# Patient Record
Sex: Male | Born: 1960 | Race: White | Hispanic: No | Marital: Married
Health system: Midwestern US, Academic
[De-identification: ages and names within clinical notes are randomized; demographics above are authoritative.]

---

## 2021-09-18 ENCOUNTER — Encounter: Admit: 2021-09-18 | Discharge: 2021-09-18 | Payer: BC Managed Care – PPO

## 2021-09-18 DIAGNOSIS — M549 Dorsalgia, unspecified: Secondary | ICD-10-CM

## 2021-09-18 NOTE — Patient Instructions
It was a pleasure seeing you in clinic today.    Jinny Sweetland RN, CNC  Dr. Joshua T. Bunch/Ortho Spine Surgery  [facility]  [facility]  [address]  [place],  [address]  Phone: [phone]   Fax [phone]  Scheduling [phone]  [email or web address]    We appreciate the interest in your health.  The physician reviews both the radiology reports and the imaging independently and will contact you if there are any emergent findings.  If you would like, incidental and chronic changes seen in your images can be reviewed with Dr. Bunch at a follow up appointment.  At that appointment, the radiology report and terminology used in that report can also be explained. Please call our Schedule line if you would like to follow up.

## 2021-09-20 ENCOUNTER — Encounter: Admit: 2021-09-20 | Discharge: 2021-09-20 | Payer: BC Managed Care – PPO

## 2021-09-20 ENCOUNTER — Ambulatory Visit: Admit: 2021-09-20 | Discharge: 2021-09-20 | Payer: BC Managed Care – PPO

## 2021-09-20 DIAGNOSIS — I83813 Varicose veins of bilateral lower extremities with pain: Secondary | ICD-10-CM

## 2021-09-20 DIAGNOSIS — I428 Other cardiomyopathies: Secondary | ICD-10-CM

## 2021-09-20 DIAGNOSIS — M5136 Other intervertebral disc degeneration, lumbar region: Secondary | ICD-10-CM

## 2021-09-20 DIAGNOSIS — M2550 Pain in unspecified joint: Secondary | ICD-10-CM

## 2021-09-20 DIAGNOSIS — M48062 Spinal stenosis, lumbar region with neurogenic claudication: Secondary | ICD-10-CM

## 2021-09-20 DIAGNOSIS — M48061 Spinal stenosis, lumbar region without neurogenic claudication: Secondary | ICD-10-CM

## 2021-09-20 DIAGNOSIS — E785 Hyperlipidemia, unspecified: Secondary | ICD-10-CM

## 2021-09-20 DIAGNOSIS — R0789 Other chest pain: Secondary | ICD-10-CM

## 2021-09-20 DIAGNOSIS — M549 Dorsalgia, unspecified: Secondary | ICD-10-CM

## 2021-09-20 DIAGNOSIS — R002 Palpitations: Secondary | ICD-10-CM

## 2021-09-20 DIAGNOSIS — R42 Dizziness and giddiness: Secondary | ICD-10-CM

## 2021-09-20 DIAGNOSIS — M5416 Radiculopathy, lumbar region: Secondary | ICD-10-CM

## 2021-09-20 DIAGNOSIS — I10 Essential (primary) hypertension: Secondary | ICD-10-CM

## 2021-09-20 NOTE — Progress Notes
SPINE CENTER HISTORY AND PHYSICAL      CHIEF COMPLAINT     Chief Complaint   Patient presents with   ? Lower Back - Pain     Pain into both legs   ? New Patient          HISTORY OF PRESENT ILLNESS   Jeremy Hardy is a 60 y.o. male.  He presents for further evaluation of low back pain.  No real pain that goes down his legs.  Worse with standing and walking.  Improves when he sits or lays down.  Positive shopping cart sign.  He has had three injections, which were done at [facility] in [place]. [place].  The first two injections helps.  The third injection gave him no relief.  His last injection was 4-5 years ago.  No RFA or spinal cord stimulator.  He has tried chiropractic treatment and physical therapy in the past.  No recent PT.  He is employed as a Scientist, research (physical sciences).      Previous Research officer, political party -  No    NSAIDS: aspirin, celecoxib (Celebrex), naproxen (Aleve)  PT: Yes - none recent  Pain medications: hydrocodone/acetaminophen (Vicodin, Lortab, Norco)  Chiropractic: Yes  Activity modification: Yes  Injections: Yes  How many?  3 - last inj. ~4-5 yrs ago       Spine Comprehensive History Form  HPI     When did your pain start?    Greater than 1 year Where is your worst pain?    Lower back    Have you been given a diagnosis for your pain?      Yes Explanation of diagnosis:      Bulginging disc, arthritis, narrowing of spine    Is this work related?    No Date of Injury:           Was there a cause of injury (e.g.. fall, MVA, heavy lifting)?      No Please explain injury:           Is the pain constant or does it come and go?     Constant Words that best describe the quality of your pain:      Aching, Shooting, Throbbing    What makes the pain better? (e.g. rest, sitting down, ice, heat, medications):      Sitting down, Medications Other reason that makes the pain better?           What makes the pain worse? (e.g. sit, stand, walk, bend):      Stand Other reason that makes the pain worse?            How long can you sit before the pain occurs? (in minutes)    0-15 How long can you stand before the pain occurs? (in minutes)    15-30 How long can you walk before the pain occurs? (in minutes)    15-30   Does the pain move into your arm or leg?    No Explanation of how far pain moves:         Do you have numbness in your arm or leg?      None Explanation of numbness in arm from start point to end point:      Explanation of numbness in leg from start point to end point:        Do you have weakness in your arm or leg?      None Explanation of weakness in arm from start  point to end point:      Explanation of weakness in leg from start point to end point:        Have you lost control of bladder or bowel function?    No Explanation of lost control of bladder or bowel function:         Is the pain worse at night?    No Description of how pain effects sleep:         Have you had any unplanned weight loss?    No       PAIN SCALE    You are here for pain. Where is the pain?    Low back    On a scale of 0 to 10, how bad is your low back pain?    10 On a scale of 0 to 10, what was your average low back pain level in the past week?    7   On a scale of 0 to 10, how bad is your leg or foot pain?      On a scale of 0 to 10, what was your average leg or foot pain level in the past week?        On a scale of 0 to 10, how bad is your middle back pain?        On a scale of 0 to 10, what was your average middle back pain level in the past week?        On a scale of 0 to 10, how bad is your neck or upper back pain?      On a scale of 0 to 10, what was your average neck or upper back pain level in the past week?        On a scale of 0 to 10, how bad is your arm or hand pain?      On a scale of 0 to 10, what was your average arm or hand pain level in the past week?          SCOLIOSIS     Being seen for scoliosis?      When was your scoliosis first detected?      Explain how your scoliosis was detected:        What was your age of onset of menstruation (period)? Please indicate any family members with scoliosis:      How much have you grown in the last year? (in inches)          TESTS FOR CONDITION DATE FACILITY (HOSPITAL/CLINIC)   X-Ray?    Yes     09/17/16     [facility]   CT Scan?    No               MRI?    Yes     06/18/21     Open MRI at [facility]   Discogram?    No               Myelogram?    No               EMG/NCV?    No               Other Tests?                      TREATMENT FOR CONDITION DATE  % OF RELIEF   Physical Therapy?  Yes     09/17/18 How long did you do physical therapy? (in weeks)    6     0   Chiropractic Care?    Yes     07/19/19 How many times did you see the chiropractor?    25     0   Injection?    Yes       12/17/14   What type of spine injection have you had in the past?    Epidural Steroid Injection     45   Surgery?    No       What type of spine surgery have you had in the past?              Psychology?    No             Other Treatments?         Comments:                  MEDICATIONS TRIED NAME DATE STARTING TAKING RELIEF   Anti-inflammatory (NSAID)? aspirin, celecoxib (Celebrex), naproxen (Aleve) 10/18/809/04/20     No Relief (Ineffective)No Relief (Ineffective)       Anti-Spasmotic? None                   Neuropathic? gabapentin (Neurontin)     08/18/20      Some relief (effective)      Relaxant? None             Anti-depressant? None                 Steroid? None             Opioid? hydrocodone/acetaminophen (Vicodin, Lortab, Norco)    10/18/13          Some relief (effective)           BLOOD THINNER   aspirin 81mg                                     :           :                                       PAST MEDICAL HISTORY     Medical History:   Diagnosis Date   ? 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 Chest pain, non-cardiac 11/03/2009   ? 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 Dizziness    ? HLD (hyperlipidemia) 05/12/2012   ? HTN (hypertension) 05/12/2012   ? Joint pain 10/15/2018   ? Palpitations    ? 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 HX JOINT REPLACEMENT  06/07/2017    3 others since   ? JOINT REPLACEMENT  08/17/2020   ? KNEE SURGERY  06/07/2017   ? UMBILICAL ARTERIAL CATH - BEDSIDE  10/15/2005       FAMILY HISTORY   family history includes Heart problem in his father and mother; Hypotension in his father and mother.    SOCIAL HISTORY     Social History     Socioeconomic History   ? Marital status: Married   Tobacco  Use   ? Smoking status: Never   ? Smokeless tobacco: Never   Substance and Sexual Activity   ? Alcohol use: Yes     Alcohol/week: 12.0 standard drinks     Types: 12 Cans of beer per week   ? Drug use: Never   ? Sexual activity: Not Currently     Partners: Female     Birth control/protection: None       ALLERGIES   No Known Allergies    MEDICATIONS     Current Outpatient Medications:   ?  aspirin EC 81 mg tablet, Take 81 mg by mouth daily., Disp: , Rfl:   ?  celecoxib (CELEBREX) 100 mg capsule, , Disp: , Rfl:   ?  ezetimibe-simvastatin (VYTORIN 10/40) 10-40 mg tablet, Take 1 Tab by mouth at bedtime daily., Disp: 90 Tab, Rfl: 3  ?  gabapentin (NEURONTIN) 300 mg capsule, TAKE TAKE 1 CAPSULE BY MOUTH IN THE MORNING,1 CAPSULE IN THE AFTERNOON,2 CAPSULES IN THE EVENING, Disp: , Rfl:   ?  HYDROcodone-acetaminophen(+) (NORCO) 7.5-325 mg tablet, Take 1 Tab by mouth every 4 hours as needed., Disp: , Rfl:   ?  lisinopril (PRINIVIL; ZESTRIL) 20 mg tablet, Take 1 Tab by mouth daily., Disp: 90 Tab, Rfl: 1  ?  metoprolol XL (TOPROL XL) 50 mg tablet, Take 1 Tab by mouth daily., Disp: 90 Tab, Rfl: 3    REVIEW OF SYSTEMS   Review of Systems   Musculoskeletal: Positive for back pain.   All other systems reviewed and are negative.      PHYSICAL EXAM     Vitals:    09/20/21 1029   BP: 107/73   Pulse: 73   SpO2: 97%   PainSc: Six   Weight: (!) 143.3 kg (316 lb)   Height: 180.3 cm (5' 11) Oswestry Total Score:: 46  Pain Score: Six  Body mass index is 44.07 kg/m?Marland Kitchen    Constitutional: Alert, NAD  Psychiatric: Mood and affect appropriate  Eyes: EOMI  Respiratory: Unlabored breathing  Cardiovascular: Regular rate  Skin: No rashes or open wounds appreciated on back  Musculoskeletal: 5/5 strength throughout bilat LEs (hip flexion, knee ext/flexion, ankle DF/PF, great toe extension), lumbar ROM limited secondary to pain, negative straight leg raise bilat  Neurologic: Sensation intact to light touch L2-S1 on today's exam, no hyperreflexia, no ankle clonus, downgoing Babinski      RADIOGRAPHIC EVALUATION     Plain films from 09/20/2021 demonstrate lumbosacral spondylosis with degeneration, mild coronal deformity.  Possibly a subtle spondylolisthesis at L5-S1.  Positive sagittal imbalance.  The patient has a left total hip.  Difficult to fully evaluate due to the patient's body habitus.    MRI from 07/13/2021.  At L2-3 and L3-4 on the left he has foraminal stenosis, on the right at L4-5 and L5-S1, and centrally most notable at L2-3.       ASSESSMENT / PLAN     Jeremy Hardy is a 60 y.o. 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    Natural history and conservative treatment options were discussed with the patient.  The patient is not currently a surgical candidate given his current weight.  Ideally he would have a BMI under 35 before he would be a surgical candidate.  I would have a goal weight for him of 250 pounds.  I think until he is able to lose  the weight, I would recommend he pursue additional non-operative options including physical therapy, chiropractic care, injections, radiofrequency ablation.  I have placed a referral to the anesthesia pain team to discuss options.  He will otherwise follow up prn.  If he is able to lose weight and he has failed all other options, I would be happy to see him back.  I think ultimately based on his symptoms this sure seems like a large degree is claudicant stenosis given the fact he improves bending over or sitting down.  That said, I think some of it is discogenic.  I think if he was able to lose some weight, I would probably start smaller rather than larger and do a stand alone decompression for the central stenosis.          In the presence of Criss Rosales, MD , I have taken down these notes, Mamie Laurel, Scribe. September 20, 2021 10:43 AM

## 2021-10-11 ENCOUNTER — Encounter: Admit: 2021-10-11 | Discharge: 2021-10-11 | Payer: BC Managed Care – PPO

## 2021-10-11 ENCOUNTER — Ambulatory Visit: Admit: 2021-10-11 | Discharge: 2021-10-11 | Payer: BC Managed Care – PPO

## 2021-10-11 DIAGNOSIS — I428 Other cardiomyopathies: Secondary | ICD-10-CM

## 2021-10-11 DIAGNOSIS — M5136 Other intervertebral disc degeneration, lumbar region: Secondary | ICD-10-CM

## 2021-10-11 DIAGNOSIS — R0789 Other chest pain: Secondary | ICD-10-CM

## 2021-10-11 DIAGNOSIS — I83813 Varicose veins of bilateral lower extremities with pain: Secondary | ICD-10-CM

## 2021-10-11 DIAGNOSIS — M2550 Pain in unspecified joint: Secondary | ICD-10-CM

## 2021-10-11 DIAGNOSIS — I10 Essential (primary) hypertension: Secondary | ICD-10-CM

## 2021-10-11 DIAGNOSIS — E785 Hyperlipidemia, unspecified: Secondary | ICD-10-CM

## 2021-10-11 DIAGNOSIS — R42 Dizziness and giddiness: Secondary | ICD-10-CM

## 2021-10-11 DIAGNOSIS — R002 Palpitations: Secondary | ICD-10-CM

## 2021-10-11 DIAGNOSIS — M47817 Spondylosis without myelopathy or radiculopathy, lumbosacral region: Secondary | ICD-10-CM

## 2021-10-11 NOTE — Progress Notes
Dear Dr. Criss Rosales,    I appreciate your kind referral of Cypress L Stripling for evaluation of pain. Please see my note below for the full details of the evaluation and management plan.    Thank you,  Shelia Media, MD    [facility] Interventional [facility]  Subjective     Chief Complaint:   Chief Complaint   Patient presents with   ? Lower Back - Pain   ? New Patient     LOWER BACK PAIN     HPI: Jeremy Hardy is a pleasant 60 y.o. 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  He describes the pain as follows:  - Pain started Greater than 1 year  - He localizes the pain to Lower back  - Radiation of pain: No    - Numbness/tingling: None  - Initial inciting injury or event: No  - Presence of pain: Constant  - He describes the pain as Aching; Shooting; Throbbing  - Alleviating factors: Sitting down; Medications  - Aggravating factors: Stand       PRIOR MEDICATIONS:   Effective  Hydrocodone     Ineffective  NSAID  Acetaminophen  Gabapentin     Unable to tolerate      Never    Lyrica  Ami/Nortriptyline  Cymbalta  Tizanidine      PRIOR INTERVENTIONS:  Spine surgery: None; L THA  Effective  ESIs - last 4-5 yrs ago    Ineffective  PT  Chiropractor    - Anticoagulants:     aspirin EC  ,,aspirin 81mg          ROS: A 10-point review of systems was negative except as noted above and below.    Past Medical History:  Medical History:   Diagnosis Date   ? 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 Chest pain, non-cardiac 11/03/2009   ? 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 Dizziness    ? HLD (hyperlipidemia) 05/12/2012   ? HTN (hypertension) 05/12/2012 ? Joint pain 10/15/2018   ? Palpitations    ? 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 Heart problem Mother    ? Hypotension Mother    ? Heart problem Father    ? Hypotension Father        Social History:  Lives in [place] [address]  Social History     Socioeconomic History   ? Marital status: Married   Tobacco Use   ? Smoking status: Never   ? Smokeless tobacco: Never   Substance and Sexual Activity   ? Alcohol use: Yes     Alcohol/week: 12.0 standard drinks     Types: 12 Cans of beer per week   ? Drug use: Never   ? Sexual activity: Not Currently     Partners: Female     Birth control/protection: None       Allergies:  No Known Allergies    Medications:    Current Outpatient Medications:   ?  aspirin EC 81 mg tablet, Take 81 mg by mouth daily., Disp: , Rfl:   ?  celecoxib (CELEBREX) 100 mg capsule, , Disp: , Rfl:   ?  ezetimibe-simvastatin (VYTORIN 10/40) 10-40 mg tablet, Take 1 Tab by mouth at bedtime daily., Disp: 90 Tab, Rfl: 3  ?  fluticasone propionate (FLONASE) 50 mcg/actuation nasal spray, suspension, SPRAY 2 SPRAYS INTO EACH NOSTRIL EVERY DAY, Disp: , Rfl:   ?  gabapentin (NEURONTIN) 300 mg capsule, TAKE TAKE 1 CAPSULE BY MOUTH IN THE MORNING,1 CAPSULE IN THE AFTERNOON,2 CAPSULES IN THE EVENING, Disp: , Rfl:   ?  HYDROcodone-acetaminophen(+) (NORCO) 7.5-325 mg tablet, Take 1 Tab by mouth every 4 hours as needed., Disp: , Rfl:   ?  lisinopril (PRINIVIL; ZESTRIL) 20 mg tablet, Take 1 Tab by mouth daily., Disp: 90 Tab, Rfl: 1  ?  metoprolol tartrate 25 mg tablet, , Disp: , Rfl:   ?  metoprolol XL (TOPROL XL) 50 mg tablet, Take 1 Tab by mouth daily., Disp: 90 Tab, Rfl: 3  ?  rosuvastatin (CRESTOR) 10 mg tablet, Take 10 mg by mouth daily., Disp: , Rfl:   ?  tamsulosin (FLOMAX) 0.4 mg capsule, Take 0.8 mg by mouth at bedtime daily., Disp: , Rfl:     Oswestry Total Score:: (P) 40  No data recorded    Physical examination:   Pulse 91  - Ht 180.3 cm (5' 11)  - Wt (!) 143.3 kg (316 lb)  - SpO2 97%  - BMI 44.07 kg/m?   Pain Score: Five    General: Alert, cooperative, no acute distress  HEENT: Normocephalic, atraumatic  Neck: Supple  Lungs: Unlabored respirations  Heart: Regular rate  Skin: Warm and dry to touch  Abdomen: Nondistended    Lumbar spine:  Appearance: No lesions or deformity  Lumbar tenderness: Yes  Pain with extension: Yes  Pain with lateral flexion: Bilateral  Sensation to light touch: Intact and equal in the bilateral lower extremities  Strength: Equal and adequate bilaterally in the flexors and extensors of the bilateral lower extremities  Straight leg raise: Negative bilaterally  SI joint tenderness: Negative bilaterally  FABER test: Negative bilaterally  FADIR test: Negative bilaterally  Gaenslen test: Deferred  SI joint distraction: Deferred  SI joint compression: Deferred  Thigh thrust: Deferred    Neurological: Alert and oriented x3.    MRI L-Spine Results: 06/2021 - DDD, facet arthropathy, central stenosis at L2-3 and foraminal stenosis on the left at L2-3 and L3-4 and on the right at L4-5 and L5-S1    Scoliosis Survey  CLINICAL INDICATION: 60 years Male; Complaint of pain in back.   COMPARISON: None   FINDINGS:   12 rib-bearing thoracic vertebral bodies, with 5 non-rib bearing lumbar   type vertebral bodies. The superior iliac crest project over the level of   mid L4. Straightened cervical lordosis with preserved thoracic kyphosis   and lumbar lordosis. Anterior sagittal imbalance. Minimal right convex   lumbar curve centered at L2. No significant coronal imbalance. 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 Partially visualized   right TKA. Moderate degenerative disc disease C4-C7, with associated   moderate facet arthritis. Mild to moderate diffuse thoracic and lumbar   spondylosis. Minimal retrolisthesis of L2 on L3. Moderate and L2-L3   through L5-S1 degenerative disc disease. 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     Last Cr and LFT's:  Creatinine   Date Value Ref Range Status   02/05/2015 1.0  Final     AST (SGOT)   Date Value Ref Range Status  02/05/2015 18  Final     ALT (SGPT)   Date Value Ref Range Status   02/05/2015 39  Final     Alk Phosphatase   Date Value Ref Range Status   02/05/2015 72  Final     Total Bilirubin   Date Value Ref Range Status   02/05/2015 0.46  Final          Assessment:  Kyce L Silverstone is a 60 y.o. male who presents for evaluation of pain. Based on history, physical exam, and imaging, the pain complaints are most likely due to:    1. DDD (degenerative disc disease), lumbar  [place] [facility] SPINE INJECT MBB LUMB/SACRAL    [place] AMB SPINE INJECT MBB LUMB/SACRAL      2. Spondylosis of lumbosacral region without myelopathy or radiculopathy  [place] AMB SPINE INJECT MBB LUMB/SACRAL    [place] AMB SPINE INJECT MBB LUMB/SACRAL          He has had an adequate trial of rest, exercise, multimodal treatment, and the passage of time without improvement of symptoms. The pain has significant impact on ability to perform ADLs and quality of life.    Plan:  1.  Medication: Continue current regimen.  2.  Diagnostics: No additional testing today.  3.  Interventions: Bilat L3-5 MBBs in anticipation of RFA    Risks/benefits of all pharmacologic and interventional treatments discussed and questions answered.        CC: Dr. Criss Rosales

## 2021-10-11 NOTE — Patient Instructions
Medial Branch Block         Medial Branch Nerves Description  Each vertebra in your spine has facets (flat surfaces). They touch where the vertebrae fit together. This forms a facet joint. Each facet joint has at least 2 medial branch nerves. They are part of the nerve pathway to and from each facet joint. A facet joint in your back or neck can become inflamed (swollen and irritated). Pain messages may then travel along the nerve pathway from the facet joint to your brain.    Medial Branch Block or Facet Joint Injection Description  This is a nerve block done as a test to see if the pain you have in your back is due to facet joint disease. You will receive about 2-6 hours of pain relief from the injection. Facet joints have nerves that carry the pain signals to the spinal cord and then to the brain, where the pain is noticed. If these nerves are blocked or numbed, they will not be able to carry the pain signals. If  your pain is from facet joint arthritis, you should have relief from pain and stiffness for a couple of hours. Based on your results from this test, your doctor will talk with you about future treatment to manage your pain.    Before your test is done  -Be sure to talk to your doctor before the injection if you:  - Have any allergies especially to medicines or latex  - Take a blood thinning medicine such as Coumadin, Plavix, Lovenox or Aspirin  - Show any signs of an active infection, such as fever or chills  - Are pregnant or think you might be  -You do not need to fast from food or liquids prior to this procedure  -You will not need a driver    What to expect  You will be placed on a table on your stomach. An area on your low back is cleaned with a soap solution. The skin is numbed. A needle is inserted through the skin and into the deeper tissues. The doctor will use an x-ray machine to guide the needle to the right spot. Numbing medicine is injected into or around the joint. You may feel some pain with the injection.    After the Procedure  We advise you to take it easy for a day or so after the procedure. Avoid bending and lifting for a few days. Avoid a tub bath for 24 hours to reduce the chance of infection. You may return to work as tolerated unless otherwise instructed by your doctor. It is not unusual for you to have a sore back after the injection. You may take ibuprofen unless you are allergic to it.    Discharge Instructions     This injection is for diagnostic purposes, it is a test. Only short term results are expected.    Though the procedure is generally safe and complications are rare, we do ask that you be aware of any of the following:   Any swelling, persistent redness, new bleeding, or drainage from the site of the injection.  You should not experience a severe headache.  You should not run a fever over 101? F.  New onset of sharp, severe back & or neck pain.  New onset of upper or lower extremity numbness or weakness.  New difficulty controlling bowel or bladder function after the injection.  New shortness of breath.    If any of these occur, please call  to report this occurrence to a nurse number provided. If you are calling after 4:00 p.m. or on weekends or holidays please call [phone] and ask to have the resident physician on call for the physician paged or go to your local emergency room.  You may experience soreness at the injection site. Ice can be applied at 20 minute intervals. Avoid application of direct heat, hot showers or hot tubs today.  Patients taking a daily blood thinner can resume their regular dose this evening.  It is important that you take all medications ordered by your pain physician. Taking medication as ordered is an important part of your pain care plan. If you cannot continue the medication plan, please notify the physician.   Remain active today. Do the activities that would normally cause you pain.  It is important for you to keep track of the results of this test on paper.  Did you get relief?  Percentage of relief?  How long did it last?  Call back to report the results to the nurse number provided for you. Use notes that you kept when giving your report. You may have to leave a message and a nurse will contact you to help you determine if you are a candidate for the Radiofrequency Ablation Procedure.      PAIN DIARY  Please report pain on 0-10 scale for each hour listed following discharge.  (0 = No pain; 5 = Moderate pain; 10 = Worst pain of your life)  TIME DAY OF PROCEDURE LOCATION OF PAIN   Pain Level  Prior to Procedure     9:00 AM     10:00     11:00     12:00 (NOON)     1:00 PM     2:00     3:00     4:00     5:00     6:00     7:00     8:00     9:00     10:00     11:00 PM     12:00 AM (MIDNIGHT)              If you are unable to keep your upcoming appointment, please notify the [facility] scheduler at [phone] at least 24 hours in advance.    When to call  Call the Pain Center at [phone] if you have:   Any signs of infection, such as redness, swelling, fever, or chills   Back feels tender to touch or it becomes more severe   Changes in muscle function, such as weakness of a leg  ? Talk to your doctor or others on your health care team if you have questions.        **Depending on your insurance, you may be required to try two of these blocks. If you achieve at least 80%pain relief from those two blocks, you may be considered a candidate for a  Radio Frequency Ablation. (Please see information below)**        Radio Frequency Ablation    Medial branch nerves in each facet joint send and carry messages about back or neck pain. Destroying a few of these nerves can keep certain pain messages from reaching your brain. This can help bring you relief. The relief typically lasts for months to years. Neurotomy/ Radio Frequency Ablation destroys a nerve to relieve pain or stop involuntary movements. The treatment uses heat, cold, radiofrequency, or chemicals to destroy the nerves near a problem  joint. This keeps some pain messages from traveling to your brain, and helps relieve your symptoms.    Before your procedure    You will be sedated during this procedure therefore should stop eating and drinking 6 hours prior. You will also need to arrange to have a driver.  If you are on any blood thinners (ex. warfarin, xarelto, eliquis) or aspirin and plavix, you may need to stop these for a few days prior to the procedure. Please discuss this with the nurse.      Discharge Instructions    Go directly home and rest. You may resume your regular activities and exercise tomorrow.   You may experience soreness at the injection site. Apply ice at 20 minute intervals for the next 24 hours. Avoid application of direct heat, hot showers or hot tubs today.  It is not uncommon to experience an increase in pain for several days and up to a week after the procedure.  Though the procedure is generally safe and complications are rare, we do ask that you be aware of any of the following:   Any swelling, persistent redness, new bleeding, or drainage from the site of the injection.  You should not experience a severe headache.  You should not run a fever over 101? F.  New onset of sharp, severe back & or neck pain.  New onset of upper or lower extremity numbness or weakness.  New difficulty controlling bowel or bladder function after the injection.  New shortness of breath.    If any of these occur, please call to report this occurrence to the nurse number provided. If you are calling after 4:00 p.m. or on weekends and holidays please call [phone] and ask to have the resident physician on call for the physician paged or go to your local emergency room.  The beneficial effect from the radiofrequency procedure may take several weeks to be demonstrated.  Take medications as directed.   Please call the nurse at the number listed above with any questions.        If you are unable to keep your upcoming appointment, please notify the [facility] scheduler at [phone] at least 24 hours in advance.        Date Last Reviewed: 01/13/2017  ? 2000-2018 [employer]. [address], [place], [place] [address]. All rights reserved. This information is not intended as a substitute for professional medical care. Always follow your healthcare professional's instructions.

## 2021-10-31 ENCOUNTER — Encounter: Admit: 2021-10-31 | Discharge: 2021-10-31 | Payer: BC Managed Care – PPO

## 2021-11-01 ENCOUNTER — Ambulatory Visit: Admit: 2021-11-01 | Discharge: 2021-11-01 | Payer: BC Managed Care – PPO

## 2021-11-01 ENCOUNTER — Encounter: Admit: 2021-11-01 | Discharge: 2021-11-01 | Payer: BC Managed Care – PPO

## 2021-11-01 DIAGNOSIS — M2550 Pain in unspecified joint: Secondary | ICD-10-CM

## 2021-11-01 DIAGNOSIS — M47817 Spondylosis without myelopathy or radiculopathy, lumbosacral region: Secondary | ICD-10-CM

## 2021-11-01 DIAGNOSIS — I83813 Varicose veins of bilateral lower extremities with pain: Secondary | ICD-10-CM

## 2021-11-01 DIAGNOSIS — I10 Essential (primary) hypertension: Secondary | ICD-10-CM

## 2021-11-01 DIAGNOSIS — I428 Other cardiomyopathies: Secondary | ICD-10-CM

## 2021-11-01 DIAGNOSIS — E785 Hyperlipidemia, unspecified: Secondary | ICD-10-CM

## 2021-11-01 DIAGNOSIS — M5136 Other intervertebral disc degeneration, lumbar region: Secondary | ICD-10-CM

## 2021-11-01 DIAGNOSIS — R002 Palpitations: Secondary | ICD-10-CM

## 2021-11-01 DIAGNOSIS — R0789 Other chest pain: Secondary | ICD-10-CM

## 2021-11-01 DIAGNOSIS — R42 Dizziness and giddiness: Secondary | ICD-10-CM

## 2021-11-01 MED ORDER — BUPIVACAINE (PF) 0.5 % (5 MG/ML) IJ SOLN
6 mL | Freq: Once | INTRAMUSCULAR | 0 refills | Status: CP
Start: 2021-11-01 — End: ?
  Administered 2021-11-01: 22:00:00 6 mL via INTRAMUSCULAR

## 2021-11-01 NOTE — Progress Notes

## 2021-11-01 NOTE — Patient Instructions
Discharge Instructions for Medial Branch Block    Important information following your procedure today: You may drive today    This injection is for diagnostic purposes, it is a test. Only short-term results are expected.    Though the procedure is generally safe and complications are rare, we do ask that you be aware of any of the following:   Any swelling, persistent redness, new bleeding, or drainage from the site of the injection.  You should not experience a severe headache.  You should not run a fever over 101 F.  New onset of sharp, severe back & or neck pain.  New onset of upper or lower extremity numbness or weakness.  New difficulty controlling bowel or bladder function after the injection.  New shortness of breath.    If any of these occur, please call to report this occurrence to Dr. Sowder at [phone]. If you are calling after 4:00 p.m. or on weekends or holidays please call [phone] and ask to have the resident physician on call for the physician paged or go to your local emergency room.    Avoid application of direct heat, hot showers or hot tubs today.    Remain active today. Do the activities that would normally cause you pain. After the injection, you should get at least 80% relief for up to 2-4 hours.    Call back to report the results to a nurse tomorrow at Dr. Sowder at [phone]. You may have to leave a message. Give your name and contact information and answer the following questions.  Did you get relief?  Percentage of relief?  How long did it last?    If you are a candidate, a scheduler will call you within the next week to schedule your next procedure. The nurse will call you back if your results were not as expected to discuss next steps in your treatment plan.      The following medications were used: Lidocaine  and Bupivicaine        If you are unable to keep your upcoming appointment, please notify the [facility] scheduler at [phone] at least 24 hours in advance.

## 2021-11-01 NOTE — Progress Notes
[facility]  INTERVENTIONAL PAIN PROCEDURE HISTORY AND PHYSICAL    Chief Complaint: Pain    HISTORY OF PRESENT ILLNESS:  Jeremy Hardy presents today for interventional treatment of his pain. He denies any fevers, chills, infection, or current use of contraindicated anticoagulants. Risks of the procedure were discussed including but not limited to bleeding, infection, damage to surrounding structures and reaction to medications. He reports understanding and has elected to proceed with the procedure.    Medical History:   Diagnosis Date   ? 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 Chest pain, non-cardiac 11/03/2009   ? 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 Dizziness    ? HLD (hyperlipidemia) 05/12/2012   ? HTN (hypertension) 05/12/2012   ? Joint pain 10/15/2018   ? Palpitations    ? 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 HX JOINT REPLACEMENT  06/07/2017    3 others since   ? JOINT REPLACEMENT  08/17/2020   ? KNEE SURGERY  06/07/2017   ? UMBILICAL ARTERIAL CATH - BEDSIDE  10/15/2005       family history includes Heart problem in his father and mother; Hypotension in his father and mother.    Social History     Socioeconomic History   ? Marital status: Married   Tobacco Use   ? Smoking status: Never   ? Smokeless tobacco: Never   Substance and Sexual Activity   ? Alcohol use: Yes     Alcohol/week: 12.0 standard drinks     Types: 12 Cans of beer per week   ? Drug use: Never   ? Sexual activity: Not Currently     Partners: Female     Birth control/protection: None       No Known Allergies    There were no vitals filed for this visit.    REVIEW OF SYSTEMS: 10 point ROS obtained and negative except pain    PHYSICAL EXAM:  General: Alert, cooperative, no distress  Head: Normocephalic, atraumatic  Lungs: Unlabored respirations  Heart: Well perfused  Abdomen: Non-distended  Musculoskeletal: Moves all extremities  Neurological: Grossly intact      IMPRESSION:    1. DDD (degenerative disc disease), lumbar    2. Spondylosis of lumbosacral region without myelopathy or radiculopathy         PLAN: Other Bilat L3-5 MBBs

## 2021-11-01 NOTE — Procedures
Attending Surgeon: Helayne Seminole, MD    Anesthesia: Local    Pre-Procedure Diagnosis:   1. DDD (degenerative disc disease), lumbar    2. Spondylosis of lumbosacral region without myelopathy or radiculopathy        Post-Procedure Diagnosis:   1. DDD (degenerative disc disease), lumbar    2. Spondylosis of lumbosacral region without myelopathy or radiculopathy        Pain Score: Four    [place] [facility] SPINE INJECT MBB LUMB/SACRAL  Procedure: medial branch block    Laterality: bilateral    Location: - L4, L5 and S1      Consent:   Consent obtained: verbal and written  Consent given by: patient  Risks discussed: allergic reaction, bleeding, bruising, infection, nerve damage, no change or worsening in pain, pneumo thorax, reaction to medication, swelling, weakness and seizure  Alternatives discussed: alternative treatment, delayed treatment, no treatment and referral  Discussed with patient the purpose of the treatment/procedure, other ways of treating my condition, including no treatment/ procedure and the risks and benefits of the alternatives. Patient has decided to proceed with treatment/procedure.        Universal Protocol:  Relevant documents: relevant documents present and verified  Test results: test results available and properly labeled  Imaging studies: imaging studies available  Required items: required blood products, implants, devices, and special equipment available  Site marked: the operative site was marked  Patient identity confirmed: Patient identify confirmed verbally with patient.        Time out: Immediately prior to procedure a time out was called to verify the correct patient, procedure, equipment, support staff and site/side marked as required      Procedures Details:   Indications: diagnostic evaluation   Prep: chlorhexidine  Specimens: none  Number of Levels: 2  Guidance: fluoroscopy  Needle and Epidural Catheter: quincke  Needle size: 25 G  Injection procedure: Incremental injection and Negative aspiration for blood  Amount Injected:   L4: 1mL  L5: 1mL  Patient tolerance: Patient tolerated the procedure well with no immediate complications. Pressure was applied, and hemostasis was accomplished.  Comments: ANESTHESIA PROCEDURE REPORT    Lumbar Facet Medial Branch Blocks    Date of Service: 11/01/2021    Procedure Title(s):    1. Bilateral medial branch nerve blocks to the bilateral L3-5 facet joints   2. Intraoperative fluoroscopy     Attending Surgeon: Helayne Seminole, MD    Anesthesia: Local anesthesia    Indications: Jeremy Hardy is a 61 y.o. male with a diagnosis of low back pain and lumbar spondylosis without myelopathy. The patient's history and physical exam were reviewed. The patient has failed conservative measures including physical therapy and medication management. On exam the patients exhibits significant tenderness in the above stated levels which is exacerbated by extension and lateral flexion to the painful sides. The risks, benefits and alternatives to the procedure were discussed, and all questions were answered to the patient's satisfaction. The patient agreed to proceed, and written informed consent was obtained.    Procedure in Detail:     The patient was brought into the procedure room and placed in the prone position on the fluoroscopy table. Standard monitors were placed, and vital signs were observed throughout the procedure. The area of the lumbar spine was prepped with 2% chlorhexidine and draped in a sterile manner.     AP fluoroscopy with oblique tilt to the right for medial branches on the right and was used to  identify the junction of the superior articular process and the transverse process at the L3-5 levels on the right side. A 25-gauge spinal needle was advanced toward each of these points under fluoroscopic guidance. Once bone was contacted, negative aspiration was confirmed and 1 mL of 0.5% bupivacaine was injected at each level.    The same procedure was performed on the opposite side?  Yes    After the procedure was completed, the patient's back was cleaned and bandages were placed at the needle insertion sites.    Disposition: The patient tolerated the procedure well, and there were no apparent complications. Vital signs remained stable througtout the procedure. The patient was taken to the recovery area where discharge instructions for the procedure were given. The patient was given a pain diary to assess response to this diagnostic block. If the patient does experience significant pain relief with the diagnostic block, consideration will be given to radiofrequency ablation of the above treated levels at a subsequent visit.    Estimated Blood Loss: Minimal    Specimens: None    Complications: None                Administrations This Visit     bupivacaine PF (MARCAINE) 0.5 % injection 6 mL     Admin Date  11/01/2021 Action  Given Dose  6 mL Route  Injection Administered By  Helayne Seminole, MD              Estimated blood loss: none or minimal  Specimens: none  Patient tolerated the procedure well with no immediate complications. Pressure was applied, and hemostasis was accomplished.

## 2021-11-02 ENCOUNTER — Encounter: Admit: 2021-11-02 | Discharge: 2021-11-02 | Payer: BC Managed Care – PPO

## 2021-11-02 DIAGNOSIS — M47817 Spondylosis without myelopathy or radiculopathy, lumbosacral region: Secondary | ICD-10-CM

## 2021-11-02 NOTE — Telephone Encounter
[place] [facility] SPINE INJECT MBB LUMB/SACRAL #190%  Procedure: medial branch block   Laterality: bilateral  Location: - L4, L5 and S1      Patient reports he had 90% relief for 6-7 hours. Reports he hwas able to increase activity during this time.

## 2021-12-19 ENCOUNTER — Encounter: Admit: 2021-12-19 | Discharge: 2021-12-19 | Payer: BC Managed Care – PPO

## 2021-12-20 ENCOUNTER — Ambulatory Visit: Admit: 2021-12-20 | Discharge: 2021-12-20 | Payer: BC Managed Care – PPO

## 2021-12-20 ENCOUNTER — Encounter: Admit: 2021-12-20 | Discharge: 2021-12-20 | Payer: BC Managed Care – PPO

## 2021-12-20 DIAGNOSIS — I10 Essential (primary) hypertension: Secondary | ICD-10-CM

## 2021-12-20 DIAGNOSIS — M2550 Pain in unspecified joint: Secondary | ICD-10-CM

## 2021-12-20 DIAGNOSIS — M47817 Spondylosis without myelopathy or radiculopathy, lumbosacral region: Secondary | ICD-10-CM

## 2021-12-20 DIAGNOSIS — R0789 Other chest pain: Secondary | ICD-10-CM

## 2021-12-20 DIAGNOSIS — I83813 Varicose veins of bilateral lower extremities with pain: Secondary | ICD-10-CM

## 2021-12-20 DIAGNOSIS — R42 Dizziness and giddiness: Secondary | ICD-10-CM

## 2021-12-20 DIAGNOSIS — E785 Hyperlipidemia, unspecified: Secondary | ICD-10-CM

## 2021-12-20 DIAGNOSIS — I428 Other cardiomyopathies: Secondary | ICD-10-CM

## 2021-12-20 DIAGNOSIS — M5136 Other intervertebral disc degeneration, lumbar region: Secondary | ICD-10-CM

## 2021-12-20 DIAGNOSIS — R002 Palpitations: Secondary | ICD-10-CM

## 2021-12-20 MED ORDER — BUPIVACAINE (PF) 0.5 % (5 MG/ML) IJ SOLN
6 mL | Freq: Once | INTRAMUSCULAR | 0 refills | Status: CP
Start: 2021-12-20 — End: ?
  Administered 2021-12-20: 21:00:00 6 mL via INTRAMUSCULAR

## 2021-12-20 NOTE — Patient Instructions
Discharge Instructions for Medial Branch Block    Important information following your procedure today: You may drive today    This injection is for diagnostic purposes, it is a test. Only short-term results are expected.    Though the procedure is generally safe and complications are rare, we do ask that you be aware of any of the following:   Any swelling, persistent redness, new bleeding, or drainage from the site of the injection.  You should not experience a severe headache.  You should not run a fever over 101 F.  New onset of sharp, severe back & or neck pain.  New onset of upper or lower extremity numbness or weakness.  New difficulty controlling bowel or bladder function after the injection.  New shortness of breath.    If any of these occur, please call to report this occurrence to Dr. Sowder at [phone]. If you are calling after 4:00 p.m. or on weekends or holidays please call [phone] and ask to have the resident physician on call for the physician paged or go to your local emergency room.    Avoid application of direct heat, hot showers or hot tubs today.    Remain active today. Do the activities that would normally cause you pain. After the injection, you should get at least 80% relief for up to 2-4 hours.    Call back to report the results to a nurse tomorrow at Dr. Sowder at [phone]. You may have to leave a message. Give your name and contact information and answer the following questions.  Did you get relief?  Percentage of relief?  How long did it last?    If you are a candidate, a scheduler will call you within the next week to schedule your next procedure. The nurse will call you back if your results were not as expected to discuss next steps in your treatment plan.      The following medications were used: Lidocaine  and Bupivicaine        If you are unable to keep your upcoming appointment, please notify the [facility] scheduler at [phone] at least 24 hours in advance.

## 2021-12-20 NOTE — Progress Notes
[facility]  INTERVENTIONAL PAIN PROCEDURE HISTORY AND PHYSICAL    Chief Complaint: Pain    HISTORY OF PRESENT ILLNESS:  Jeremy Hardy presents today for interventional treatment of his pain. He denies any fevers, chills, infection, or current use of contraindicated anticoagulants. Risks of the procedure were discussed including but not limited to bleeding, infection, damage to surrounding structures and reaction to medications. He reports understanding and has elected to proceed with the procedure.    [place] [facility] SPINE INJECT MBB LUMB/SACRAL #190%  Procedure:?medial branch block?  Laterality:?bilateral  Location: -?L4, L5 and S1  ?  Patient reports he had 90% relief for 6-7 hours. Reports he hwas able to increase activity during this time.     Medical History:   Diagnosis Date   ? 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 Chest pain, non-cardiac 11/03/2009   ? 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 Dizziness    ? HLD (hyperlipidemia) 05/12/2012   ? HTN (hypertension) 05/12/2012   ? Joint pain 10/15/2018   ? Palpitations    ? 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 HX JOINT REPLACEMENT  06/07/2017    3 others since   ? JOINT REPLACEMENT  08/17/2020   ? KNEE SURGERY  06/07/2017   ? UMBILICAL ARTERIAL CATH - BEDSIDE  10/15/2005       family history includes Heart problem in his father and mother; Hypotension in his father and mother.    Social History     Socioeconomic History   ? Marital status: Married   Tobacco Use   ? Smoking status: Never   ? Smokeless tobacco: Never   Substance and Sexual Activity   ? Alcohol use: Yes     Alcohol/week: 12.0 standard drinks     Types: 12 Cans of beer per week   ? Drug use: Never   ? Sexual activity: Not Currently     Partners: Female     Birth control/protection: None       No Known Allergies    Vitals:    12/20/21 1358   BP: 101/65   BP Source: Arm, Right Upper   Pulse: 113   Temp: 36.8 ?C (98.2 ?F)   Resp: 16   SpO2: 96%   TempSrc: Oral   PainSc: Eight   Weight: (!) 143.3 kg (316 lb)   Height: 180.3 cm (5' 11)       REVIEW OF SYSTEMS: 10 point ROS obtained and negative except pain    PHYSICAL EXAM:  General: Alert, cooperative, no distress  Head: Normocephalic, atraumatic  Lungs: Unlabored respirations  Heart: Well perfused  Abdomen: Non-distended  Musculoskeletal: Moves all extremities  Neurological: Grossly intact      IMPRESSION:    1. Spondylosis of lumbosacral region without myelopathy or radiculopathy         PLAN: Other bilat L4-S1 MBBs

## 2021-12-20 NOTE — Procedures
Attending Surgeon: Helayne Seminole, MD    Anesthesia: Local    Pre-Procedure Diagnosis:   1. Spondylosis of lumbosacral region without myelopathy or radiculopathy        Post-Procedure Diagnosis:   1. Spondylosis of lumbosacral region without myelopathy or radiculopathy        Pain Score: Eight    [place] [facility] INJECT MBB LUMB/SACRAL  Procedure: medial branch block    Laterality: bilateral    Location: - L4, L5 and S1      Consent:   Consent obtained: verbal and written  Consent given by: patient  Risks discussed: allergic reaction, bleeding, bruising, infection, nerve damage, no change or worsening in pain, pneumo thorax, reaction to medication, swelling, weakness and seizure  Alternatives discussed: alternative treatment, delayed treatment, no treatment and referral  Discussed with patient the purpose of the treatment/procedure, other ways of treating my condition, including no treatment/ procedure and the risks and benefits of the alternatives. Patient has decided to proceed with treatment/procedure.        Universal Protocol:  Relevant documents: relevant documents present and verified  Test results: test results available and properly labeled  Imaging studies: imaging studies available  Required items: required blood products, implants, devices, and special equipment available  Site marked: the operative site was marked  Patient identity confirmed: Patient identify confirmed verbally with patient.        Time out: Immediately prior to procedure a time out was called to verify the correct patient, procedure, equipment, support staff and site/side marked as required      Procedures Details:   Indications: diagnostic evaluation   Prep: chlorhexidine  Specimens: none  Number of Levels: 2  Guidance: fluoroscopy  Needle and Epidural Catheter: quincke  Needle size: 25 G  Injection procedure: Incremental injection and Negative aspiration for blood  Amount Injected:   L4: 1mL  L5: 1mL  Patient tolerance: Patient tolerated the procedure well with no immediate complications. Pressure was applied, and hemostasis was accomplished.  Comments: ANESTHESIA PROCEDURE REPORT    Lumbar Facet Medial Branch Blocks    Date of Service: 12/20/2021    Procedure Title(s):    1. Bilateral medial branch nerve blocks to the bilateral L4-5, L5-S1 facet joints   2. Intraoperative fluoroscopy     Attending Surgeon: Helayne Seminole, MD    Anesthesia: Local anesthesia    Indications: Jeremy Hardy is a 61 y.o. male with a diagnosis of low back pain and lumbar spondylosis without myelopathy. The patient's history and physical exam were reviewed. The patient has failed conservative measures including physical therapy and medication management. On exam the patients exhibits significant tenderness in the above stated levels which is exacerbated by extension and lateral flexion to the painful sides. The risks, benefits and alternatives to the procedure were discussed, and all questions were answered to the patient's satisfaction. The patient agreed to proceed, and written informed consent was obtained.    Procedure in Detail:     The patient was brought into the procedure room and placed in the prone position on the fluoroscopy table. Standard monitors were placed, and vital signs were observed throughout the procedure. The area of the lumbar spine was prepped with 2% chlorhexidine and draped in a sterile manner.     AP fluoroscopy with oblique tilt to the right for medial branches on the right and was used to identify the junction of the superior articular process and the transverse process at the L4 and L5  levels on the right side. The sacral ala was identified and marked. A 25-gauge spinal needle was advanced toward each of these points under fluoroscopic guidance. Once bone was contacted, negative aspiration was confirmed and 1 mL of 0.5% bupivacaine was injected at each level.    The same procedure was performed on the opposite side?  Yes    After the procedure was completed, the patient's back was cleaned and bandages were placed at the needle insertion sites.    Disposition: The patient tolerated the procedure well, and there were no apparent complications. Vital signs remained stable througtout the procedure. The patient was taken to the recovery area where discharge instructions for the procedure were given. The patient was given a pain diary to assess response to this diagnostic block. If the patient does experience significant pain relief with the diagnostic block, consideration will be given to radiofrequency ablation of the above treated levels at a subsequent visit.    Estimated Blood Loss: Minimal    Specimens: None    Complications: None                Administrations This Visit     bupivacaine PF (MARCAINE) 0.5 % injection 6 mL     Admin Date  12/20/2021 Action  Given Dose  6 mL Route  Injection Administered By  Helayne Seminole, MD              Estimated blood loss: none or minimal  Specimens: none  Patient tolerated the procedure well with no immediate complications. Pressure was applied, and hemostasis was accomplished.

## 2021-12-20 NOTE — Progress Notes

## 2021-12-21 ENCOUNTER — Encounter: Admit: 2021-12-21 | Discharge: 2021-12-21 | Payer: BC Managed Care – PPO

## 2021-12-21 NOTE — Telephone Encounter
[place] [facility] INJECT MBB LUMB/SACRAL#2  Procedure:medial branch block  Laterality:bilateral  Location: -L4, L5 and S1    Patient reports that he had 80% relief for 2-3 hours following MBB.

## 2022-01-23 ENCOUNTER — Encounter: Admit: 2022-01-23 | Discharge: 2022-01-23 | Payer: BC Managed Care – PPO

## 2022-01-23 NOTE — Progress Notes
[facility]  INTERVENTIONAL PAIN PROCEDURE HISTORY AND PHYSICAL    Chief Complaint: Pain    HISTORY OF PRESENT ILLNESS:    Patient returns today for interventional treatment of axial back pain. Patient denies any recent fevers, chills, infection, antibiotics, coagulopathy or contra-indicated anticoagulants. Risks of the procedure were discussed including but not limited to bleeding, infection, damage to surrounding structures and reaction to medications. Patient reports understanding and has elected to proceed with the procedure.      This patient had at least 80% pain relief for at least 2 hours with the last MBB injection. The pain score was 10/10 prior to the injection and 2/10 following the injection.    Radiofrequency Ablation Checklist:    If repeat radiofrequency ablation   50% improvement for at least 6 months []        50% consistent relief for at least 6 months []     Radiofrequency ablation completed on same level over 2 years ago   No        Medical History:   Diagnosis Date   ? 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 Chest pain, non-cardiac 11/03/2009   ? 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 Dizziness    ? HLD (hyperlipidemia) 05/12/2012   ? HTN (hypertension) 05/12/2012   ? Joint pain 10/15/2018   ? Palpitations    ? 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 HX JOINT REPLACEMENT  06/07/2017    3 others since   ? JOINT REPLACEMENT  08/17/2020   ? KNEE SURGERY  06/07/2017   ? UMBILICAL ARTERIAL CATH - BEDSIDE  10/15/2005       family history includes Heart problem in his father and mother; Hypotension in his father and mother.    Social History     Socioeconomic History   ? Marital status: Married   Tobacco Use   ? Smoking status: Never   ? Smokeless tobacco: Never   Substance and Sexual Activity   ? Alcohol use: Yes     Alcohol/week: 12.0 standard drinks     Types: 12 Cans of beer per week   ? Drug use: Never   ? Sexual activity: Not Currently     Partners: Female     Birth control/protection: None       No Known Allergies        REVIEW OF SYSTEMS: 10 point ROS obtained and negative except as above.      PHYSICAL EXAM:  General: Alert, cooperative, no acute distress.  HEENT: Normocephalic, atraumatic.  Neck: Supple.  Lungs: Unlabored respirations, bilateral and equal chest excursion.  Heart: Regular rate.  Skin: Warm and dry to touch.  Abdomen: Nondistended.  MSK: No deformity  Neurological: Alert and oriented x3.      General Pre Procedural Sedation ASA Classification      I have discussed risks and alternatives of this type of sedation and procedure with: patient and significant other    NPO Status:Acceptable    Pregnancy Status: No    Prior Anesthetic Types: Anxiolysis    Patient's had previous experience with anesthesia and/or sedation complications: No    Family history of sedation complications: No    Airway: Airway assessment performed Mallampati  II (soft palate, uvula, fauces visible)    Head and Neck: No abnormalities noted    Mouth: No abnormalities noted    Medications for  Procedural Sedation: Midazolam and Fentanyl    Anesthesia Classification:  ASA 3    Patient remains a candidate for procedure: Yes    The intention for the procedure today is Anxiolysis/Analgesia.           IMPRESSION:  No diagnosis found.     PLAN: Bilateral L4-S1 RFA      Chelsea Primus, MD  Anesthesiology & Interventional Pain Fellow  [phone]

## 2022-01-24 ENCOUNTER — Encounter: Admit: 2022-01-24 | Discharge: 2022-01-24 | Payer: BC Managed Care – PPO

## 2022-01-24 ENCOUNTER — Ambulatory Visit: Admit: 2022-01-24 | Discharge: 2022-01-24 | Payer: BC Managed Care – PPO

## 2022-01-24 DIAGNOSIS — R42 Dizziness and giddiness: Secondary | ICD-10-CM

## 2022-01-24 DIAGNOSIS — I10 Essential (primary) hypertension: Secondary | ICD-10-CM

## 2022-01-24 DIAGNOSIS — E785 Hyperlipidemia, unspecified: Secondary | ICD-10-CM

## 2022-01-24 DIAGNOSIS — R002 Palpitations: Secondary | ICD-10-CM

## 2022-01-24 DIAGNOSIS — M5136 Other intervertebral disc degeneration, lumbar region: Secondary | ICD-10-CM

## 2022-01-24 DIAGNOSIS — M47817 Spondylosis without myelopathy or radiculopathy, lumbosacral region: Secondary | ICD-10-CM

## 2022-01-24 DIAGNOSIS — R0789 Other chest pain: Secondary | ICD-10-CM

## 2022-01-24 DIAGNOSIS — I83813 Varicose veins of bilateral lower extremities with pain: Secondary | ICD-10-CM

## 2022-01-24 DIAGNOSIS — M2550 Pain in unspecified joint: Secondary | ICD-10-CM

## 2022-01-24 DIAGNOSIS — I428 Other cardiomyopathies: Secondary | ICD-10-CM

## 2022-01-24 MED ORDER — FENTANYL CITRATE (PF) 50 MCG/ML IJ SOLN
50-100 ug | INTRAVENOUS | 0 refills | Status: DC | PRN
Start: 2022-01-24 — End: 2022-01-24
  Administered 2022-01-24 (×2): 50 ug via INTRAVENOUS

## 2022-01-24 MED ORDER — MIDAZOLAM 1 MG/ML IJ SOLN
1-2 mg | INTRAVENOUS | 0 refills | Status: DC | PRN
Start: 2022-01-24 — End: 2022-01-24
  Administered 2022-01-24 (×2): 1 mg via INTRAVENOUS

## 2022-01-24 MED ORDER — BUPIVACAINE (PF) 0.5 % (5 MG/ML) IJ SOLN
10 mL | Freq: Once | INTRAMUSCULAR | 0 refills | Status: CP
Start: 2022-01-24 — End: ?
  Administered 2022-01-24: 20:00:00 10 mL via INTRAMUSCULAR

## 2022-01-24 MED ORDER — LIDOCAINE (PF) 20 MG/ML (2 %) IJ SOLN
5 mL | Freq: Once | INTRAMUSCULAR | 0 refills | Status: CP
Start: 2022-01-24 — End: ?
  Administered 2022-01-24: 20:00:00 100 mg via INTRAMUSCULAR

## 2022-01-24 NOTE — Patient Instructions
Discharge Instructions for Radiofrequency Ablation    Important information following your procedure today: You may NOT drive today    Go directly home and rest. You may resume your regular activities and exercise tomorrow.   You may experience soreness at the injection site. Apply ice at 20 minute intervals for             the next 24 hours. Avoid application of direct heat, hot showers or hot tubs today.  It is not uncommon to experience an increase in pain for several days and up to a week after the procedure.  Though the procedure is generally safe and complications are rare, we do ask that you be aware of any of the following:   Any swelling, persistent redness, new bleeding, or drainage from the site of the injection.  You should not experience a severe headache.  You should not run a fever over 101 F.  New onset of sharp, severe back & or neck pain.  New onset of upper or lower extremity numbness or weakness.  New difficulty controlling bowel or bladder function after the injection.  New shortness of breath.    If any of these occur, please call to report this occurrence to Dr. Sowder at [phone]. If you are calling after 4:00 p.m. or on weekends and holidays please call [phone] and ask to have the resident physician on call for the physician paged or go to your local emergency room.  The beneficial effect from the radiofrequency procedure may take several weeks to be demonstrated.  Take medications as directed.   Please call the nurse at the number listed above with any questions.        The following medications were used: Lidocaine , Versed, and Fentanyl    If you are unable to keep your upcoming appointment, please notify the [facility] scheduler at [phone] at least 24 hours in advance.

## 2022-01-24 NOTE — Progress Notes
1438 -Sedation physician present in room. Recent vitals and patient condition reviewed between sedation physician and nurse. Reassessment completed. Determination made to proceed with planned sedation.

## 2022-01-24 NOTE — Procedures
Attending Surgeon: Helayne Seminole, MD    Anesthesia: Local    Pre-Procedure Diagnosis:   1. Spondylosis of lumbosacral region without myelopathy or radiculopathy        Post-Procedure Diagnosis:   1. Spondylosis of lumbosacral region without myelopathy or radiculopathy        Pain Score: Four    DESTRUCTION OF NERVE W/ FLUORO LUMBAR/SACRAL    Laterality: bilateral    Location: Lumbar/Sacral -  L4, S1 and L5      Consent:   Consent obtained: verbal and written  Consent given by: patient  Risks discussed: allergic reaction, bleeding, no change or worsening in pain, nerve damage, infection, reaction to medication, sensation loss, skin burn, swelling, weakness and bruising  Alternatives discussed: alternative treatment, delayed treatment, no treatment and referral  Discussed with patient the purpose of the treatment/procedure, other ways of treating my condition, including no treatment/ procedure and the risks and benefits of the alternatives. Patient has decided to proceed with treatment/procedure.        Universal Protocol:  Relevant documents: relevant documents present and verified  Test results: test results available and properly labeled  Imaging studies: imaging studies available  Required items: required blood products, implants, devices, and special equipment available  Site marked: the operative site was marked  Patient identity confirmed: Patient identify confirmed verbally with patient.        Time out: Immediately prior to procedure a time out was called to verify the correct patient, procedure, equipment, support staff and site/side marked as required      Procedures Details:   Indications: pain and axial pain     Prep: chlorhexidine    Sedation: anxiolysisPatient position: prone  Estimated Blood Loss: minimal  Specimens: none  Amount Injected:   L4: 1mL  L5: 1mL  S1: 1mL  Number of Levels Ablated: 2  Approach: paramedian  Guidance: fluoroscopy  Needle size: 18 G  Active Needle Tip Length: 10mm  Neurolytic Technique: Radiofrequency Ablation  Sensory Testing: Sensory testing complete per protocol  Motor Testing: Motor testing complete per protocol   Injection procedure: Incremental injection and Negative aspiration for blood  Patient tolerance: Patient tolerated the procedure well with no immediate complications. Pressure was applied, and hemostasis was accomplished.  Comments:                   INTERVENTIONAL PAIN MANAGEMENT PROCEDURE REPORT    Radiofrequency Ablation (RFA) of Lumbar Medial Branch Nerves    Date of Service: 01/24/2022    Procedure Title(s):    1. Radiofrequency ablation of bilateral medial branch nerves to the L4-S1 facet joints   2. Intraoperative fluoroscopy    Attending Surgeon: Helayne Seminole, MD    Anesthesia: Local                       Anxiolysis: Yes    Indications: Jeremy Hardy is a 61 y.o. male with a diagnosis of lumbar spondylosis. The patient's history and physical exam were reviewed. The patient has failed conservative measures including physical therapy and medication management. On exam the patient exhibits significant tenderness in the above stated levels which is exacerbated by extension and lateral flexion to the painful sides. The patient has had 2 medial branch blocks with greater than 80% reduction in pain for the duration of the local anesthetic. The risks, benefits and alternatives to the procedure were discussed, and all questions were answered to the patient's satisfaction. The patient agreed  to proceed, and written informed consent was obtained.     Procedure in Detail: IV was started? Yes    The patient was brought into the procedure room and placed in the prone position on the fluoroscopy table. Standard monitors were placed, and vital signs were observed throughout the procedure. The area of the lumbar spine and upper buttocks were prepped with 2% chlorhexidine and draped in a sterile manner. AP fluoroscopy with oblique tilt to the right was used to identify and mark the junction between the superior articular process and transverse process at the L4 and L5 levels. The right sacral ala was identified and marked. The skin and subcutaneous tissues in these identified areas were anesthetized with 1% lidocaine. An 18-gauge, 10 mm active tip radiofrequency probe was advanced toward each of these points under fluoroscopic guidance. Once bone was contacted, negative aspiration was confirmed. Motor stimulation at 2 Hz and 2 volts was negative. 1mL of a solution of 2% lidocaine and 0.5% bupivacaine was injected prior to lesioning, which was performed for 90 seconds at 80 degrees centigrade.     The probes were removed with a 1% lidocaine flush. The patient's back was cleaned, and bandages were placed at the needle insertion sites.    This procedure was repeated on the contralateral side.     Disposition: The patient tolerated the procedure well, and there were no apparent complications. Vital signs remained stable throughout the procedure. The patient was taken to the recovery area where discharge instructions for the procedure were given.    Estimated Blood Loss: Minimal    Specimens: None    Complications: None            Radiofrequency time 90  Radiofrequency Temperature 80        Administrations This Visit     bupivacaine PF (MARCAINE) 0.5 % injection 10 mL     Admin Date  01/24/2022 Action  Given Dose  10 mL Route  Injection Administered By  Helayne Seminole, MD          fentaNYL citrate PF (SUBLIMAZE) injection 50-100 mcg     Admin Date  01/24/2022 Action  Given Dose  50 mcg Route  Intravenous Administered By  Dede Query, RN           Admin Date  01/24/2022 Action  Given Dose  50 mcg Route  Intravenous Administered By  Dede Query, RN          lidocaine PF 20 mg/mL (2 %) injection 100 mg     Admin Date  01/24/2022 Action  Given Dose  100 mg Route  Injection Administered By  Helayne Seminole, MD          midazolam (VERSED) injection 1-2 mg     Admin Date  01/24/2022 Action  Given Dose  1 mg Route  Intravenous Administered By  Dede Query, RN           Admin Date  01/24/2022 Action  Given Dose  1 mg Route  Intravenous Administered By  Dede Query, RN              Estimated blood loss: none or minimal  Specimens: none  Patient tolerated the procedure well with no immediate complications. Pressure was applied, and hemostasis was accomplished.

## 2022-01-24 NOTE — Progress Notes

## 2022-02-28 ENCOUNTER — Encounter: Admit: 2022-02-28 | Discharge: 2022-02-28 | Payer: BC Managed Care – PPO

## 2022-02-28 ENCOUNTER — Ambulatory Visit: Admit: 2022-02-28 | Discharge: 2022-02-28 | Payer: BC Managed Care – PPO

## 2022-02-28 DIAGNOSIS — M48062 Spinal stenosis, lumbar region with neurogenic claudication: Secondary | ICD-10-CM

## 2022-02-28 DIAGNOSIS — R42 Dizziness and giddiness: Secondary | ICD-10-CM

## 2022-02-28 DIAGNOSIS — M5136 Other intervertebral disc degeneration, lumbar region: Secondary | ICD-10-CM

## 2022-02-28 DIAGNOSIS — R002 Palpitations: Secondary | ICD-10-CM

## 2022-02-28 DIAGNOSIS — I83813 Varicose veins of bilateral lower extremities with pain: Secondary | ICD-10-CM

## 2022-02-28 DIAGNOSIS — I10 Essential (primary) hypertension: Secondary | ICD-10-CM

## 2022-02-28 DIAGNOSIS — M5417 Radiculopathy, lumbosacral region: Secondary | ICD-10-CM

## 2022-02-28 DIAGNOSIS — M2550 Pain in unspecified joint: Secondary | ICD-10-CM

## 2022-02-28 DIAGNOSIS — I428 Other cardiomyopathies: Secondary | ICD-10-CM

## 2022-02-28 DIAGNOSIS — R0789 Other chest pain: Secondary | ICD-10-CM

## 2022-02-28 DIAGNOSIS — E785 Hyperlipidemia, unspecified: Secondary | ICD-10-CM

## 2022-02-28 NOTE — Patient Instructions
It was nice to see you today. Thank you for choosing to visit our clinic.       Your time is important and if you had to wait today, we do apologize. Our goal is to run exactly on time; however, on occasion, we get behind in clinic due to unexpected patient issues. Thank you for your patience.    General Instructions:  How to reach me: Please send a MyChart message to the [facility] or leave a voicemail on [phone] with Kelsey, RN.   How to get a medication refill: Please use the MyChart Refill request or contact your pharmacy directly to request medication refills. We do not do same day refills on controlled substances.  How to receive your test results: If you have signed up for MyChart, you will receive your test results and messages from me this way. Otherwise, you will get a phone call or letter. If you are expecting results and have not heard from my office within 2 weeks of your testing, please send a MyChart message or call my office.  Scheduling: Our scheduling phone number is [phone].  Appointment Reminders on your cell phone: Communication preferences can be managed in MyChart to ensure you receive important appointment notifications.  Support for many chronic illnesses is available through [facility]: [email or web address] or [phone].  For questions on nights, weekends or holidays, call the Operator at [phone], and ask for the doctor on call for Anesthesia Pain.      Again, thank you for coming in today.

## 2022-02-28 NOTE — Progress Notes
[facility] Interventional [facility]  Subjective     Chief Complaint:   Chief Complaint   Patient presents with   ? Lower Back - Pain   ? Follow Up     Follow up        HPI: Jeremy Hardy is a pleasant 61 y.o. 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    He presents for follow up after bilat L4-S1 RFA. He reports 70% improvement of his pain for around 1 week. The pain is now back to the same as it was or worse. Today his worst pain is in his lumbar spine midline. There is no numbness/tingling in the lower legs. No bowel or bladder incontinence. The pain is worse with standing up, bending over. The pain improves with rest and medication (hydrocodone). He does report that bending over a railing to take the pressure off helps the pain. He has been taking norco, gabapentin, and celebrex. The pain is sharp in quality.       PRIOR MEDICATIONS:   Effective  Hydrocodone     Ineffective  NSAID   Acetaminophen  Gabapentin     Unable to tolerate     Never  Lyrica  Ami/Nortriptyline  Cymbalta  Tizanidine    PRIOR INTERVENTIONS:  Spine surgery: None; L THA  Effective  ESIs - last 4-5 yrs ago    Ineffective  PT  Chiropractor  Bilat L4-S1 RFA    - Anticoagulants:     aspirin EC  ,,aspirin 81mg              Past Medical History:  Medical History:   Diagnosis Date   ? 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 Chest pain, non-cardiac 11/03/2009   ? 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 Dizziness    ? HLD (hyperlipidemia) 05/12/2012   ? HTN (hypertension) 05/12/2012   ? Joint pain 10/15/2018   ? Palpitations    ? 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 Heart problem Mother    ? Hypotension Mother    ? Heart problem Father    ? Hypotension Father        Social History:  Lives in [place] [id number]  Social History     Socioeconomic History   ? Marital status: Married   Tobacco Use   ? Smoking status: Never   ? Smokeless tobacco: Never   Substance and Sexual Activity   ? Alcohol use: Yes     Alcohol/week: 12.0 standard drinks of alcohol     Types: 12 Cans of beer per week   ? Drug use: Never   ? Sexual activity: Not Currently     Partners: Female     Birth control/protection: None       Allergies:  No Known Allergies    Medications:    Current Outpatient Medications:   ?  aspirin EC 81 mg tablet, Take one tablet by mouth daily., Disp: , Rfl:   ?  celecoxib (CELEBREX) 100 mg capsule, , Disp: , Rfl:   ?  ezetimibe-simvastatin (VYTORIN 10/40) 10-40 mg tablet, Take 1  Tab by mouth at bedtime daily., Disp: 90 Tab, Rfl: 3  ?  fluticasone propionate (FLONASE) 50 mcg/actuation nasal spray, suspension, SPRAY 2 SPRAYS INTO EACH NOSTRIL EVERY DAY, Disp: , Rfl:   ?  gabapentin (NEURONTIN) 300 mg capsule, TAKE TAKE 1 CAPSULE BY MOUTH IN THE MORNING,1 CAPSULE IN THE AFTERNOON,2 CAPSULES IN THE EVENING, Disp: , Rfl:   ?  HYDROcodone-acetaminophen(+) (NORCO) 7.5-325 mg tablet, Take 1 Tab by mouth every 4 hours as needed., Disp: , Rfl:   ?  lisinopril (PRINIVIL; ZESTRIL) 20 mg tablet, Take 1 Tab by mouth daily. (Patient taking differently: Take 20 mg by mouth as Needed.), Disp: 90 Tab, Rfl: 1  ?  metoprolol tartrate 25 mg tablet, one tablet twice daily., Disp: , Rfl:   ?  rosuvastatin (CRESTOR) 10 mg tablet, Take one tablet by mouth daily., Disp: , Rfl:   ?  tamsulosin (FLOMAX) 0.4 mg capsule, Take two capsules by mouth at bedtime daily., Disp: , Rfl:     Oswestry Total Score:: 52  No data recorded    Physical examination:   BP (!) (P) 133/94  - Pulse 79  - Ht 180.3 cm (5' 11)  - Wt (!) 143.3 kg (316 lb)  - SpO2 93%  - BMI 44.07 kg/m?   Pain Score: Eight    General: Alert, cooperative, no acute distress  HEENT: Normocephalic, atraumatic  Neck: Supple  Lungs: Unlabored respirations  Heart: Regular rate  Skin: Warm and dry to touch    Lumbar spine:  Appearance: No lesions or deformity  Lumbar tenderness: No  Sensation to light touch: Intact and equal in the bilateral lower extremities  Strength: Equal and adequate bilaterally in the flexors and extensors of the bilateral lower extremities  Straight leg raise: Negative bilaterally  SI joint tenderness: Positive on the left  FABER test: Negative bilaterally      Neurological: Alert and oriented x3.     MRI L-Spine Results: 06/2021 - DDD, facet arthropathy, central stenosis at L2-3 and foraminal stenosis on the left at L2-3 and L3-4 and on the right at L4-5 and L5-S1    Scoliosis Survey  CLINICAL INDICATION: 60 years Male; Complaint of pain in back.   COMPARISON: None   FINDINGS:   12 rib-bearing thoracic vertebral bodies, with 5 non-rib bearing lumbar   type vertebral bodies. The superior iliac crest project over the level of   mid L4. Straightened cervical lordosis with preserved thoracic kyphosis   and lumbar lordosis. Anterior sagittal imbalance. Minimal right convex   lumbar curve centered at L2. No significant coronal imbalance. 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 Partially visualized   right TKA. Moderate degenerative disc disease C4-C7, with associated   moderate facet arthritis. Mild to moderate diffuse thoracic and lumbar   spondylosis. Minimal retrolisthesis of L2 on L3. Moderate and L2-L3   through L5-S1 degenerative disc disease. 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     Last Cr and LFT's:  Creatinine   Date Value Ref Range Status   02/05/2015 1.0  Final     AST (SGOT)   Date Value Ref Range Status   02/05/2015 18  Final     ALT (SGPT)   Date Value Ref Range Status   02/05/2015 39  Final Alk Phosphatase   Date Value Ref Range Status   02/05/2015 72  Final     Total Bilirubin   Date Value Ref Range Status   02/05/2015  0.46  Final          Assessment:  Griffith L Soranno is a 61 y.o. male who presents for evaluation of pain. Based on history, physical exam, and imaging, the pain complaints are most likely due to:    1. Lumbosacral radiculopathy  [place] AM[facility] SPINE INJECT SNRB/TFESI LUMBAR/SACRAL      2. DDD (degenerative disc disease), lumbar        3. Spinal stenosis of lumbar region with neurogenic claudication            He has had an adequate trial of rest, exercise, multimodal treatment, and the passage of time without improvement of symptoms. The pain has significant impact on ability to perform ADLs and quality of life.    Plan:   1.  Medication: Continue current regimen.  2.  Diagnostics: No additional testing today.  3.  Interventions: Bilateral L2-3 TFESIs    Risks/benefits of all pharmacologic and interventional treatments discussed and questions answered.        ATTESTATION  I personally performed the key portions of the E/M visit, discussed case with the resident/fellow and concur with documentation of history, physical exam, assessment, and treatment plan unless otherwise noted.    Staff name:  Helayne Seminole, MD Date:  02/28/2022

## 2022-03-21 ENCOUNTER — Encounter: Admit: 2022-03-21 | Discharge: 2022-03-21 | Payer: BC Managed Care – PPO

## 2022-04-26 ENCOUNTER — Encounter: Admit: 2022-04-26 | Discharge: 2022-04-26 | Payer: BC Managed Care – PPO

## 2023-07-19 IMAGING — CR [ID]
3 series · 3 of 3 positions shown · non-contrast
Comparison: none

[chest pa]
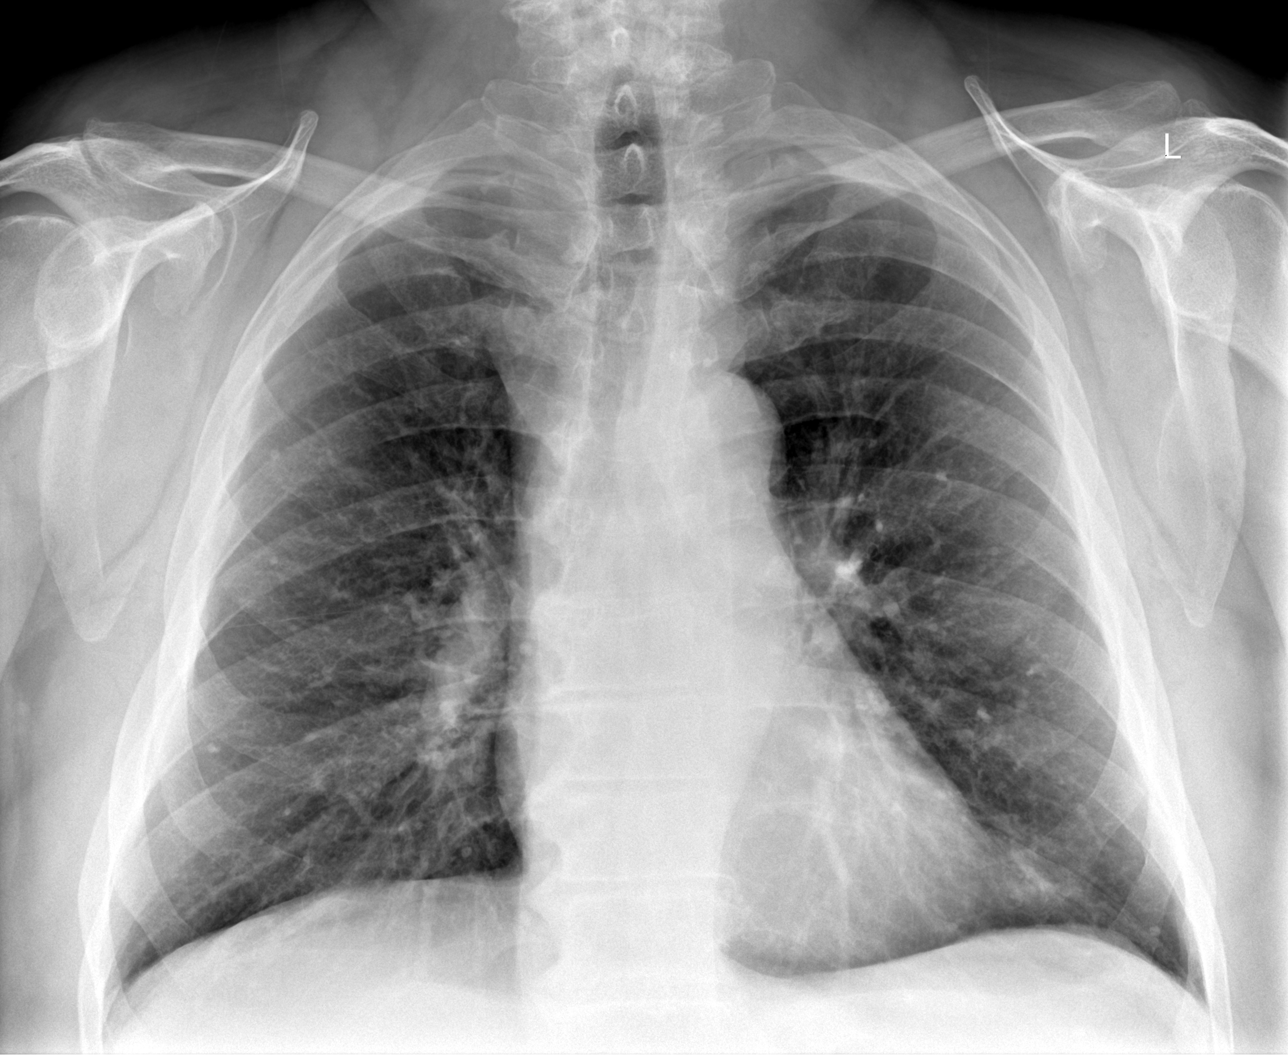

[chest lat (1 of 2)]
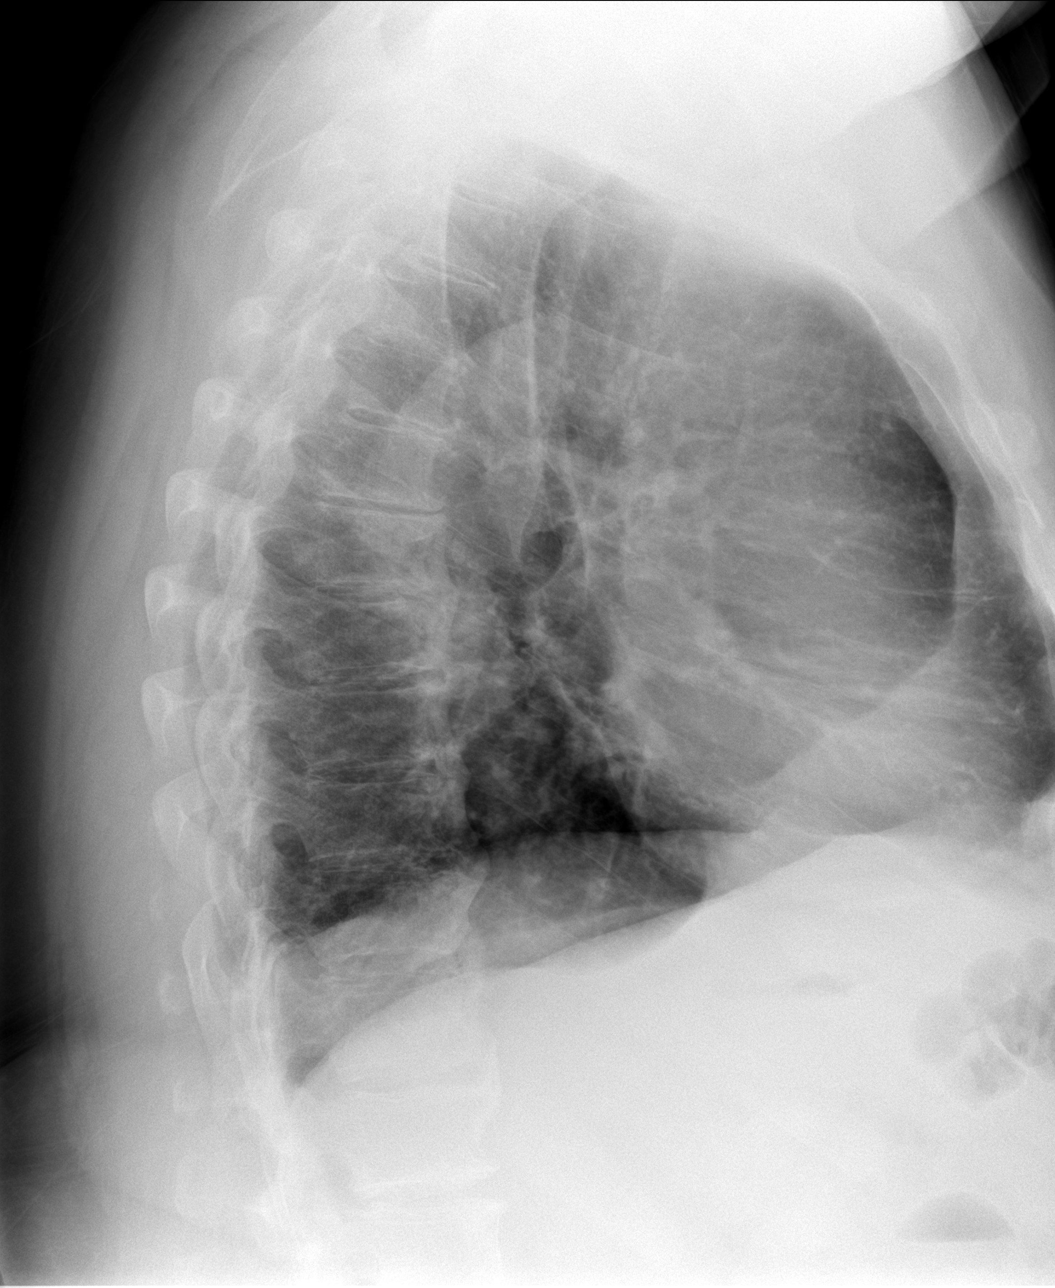

[chest lat (2 of 2)]
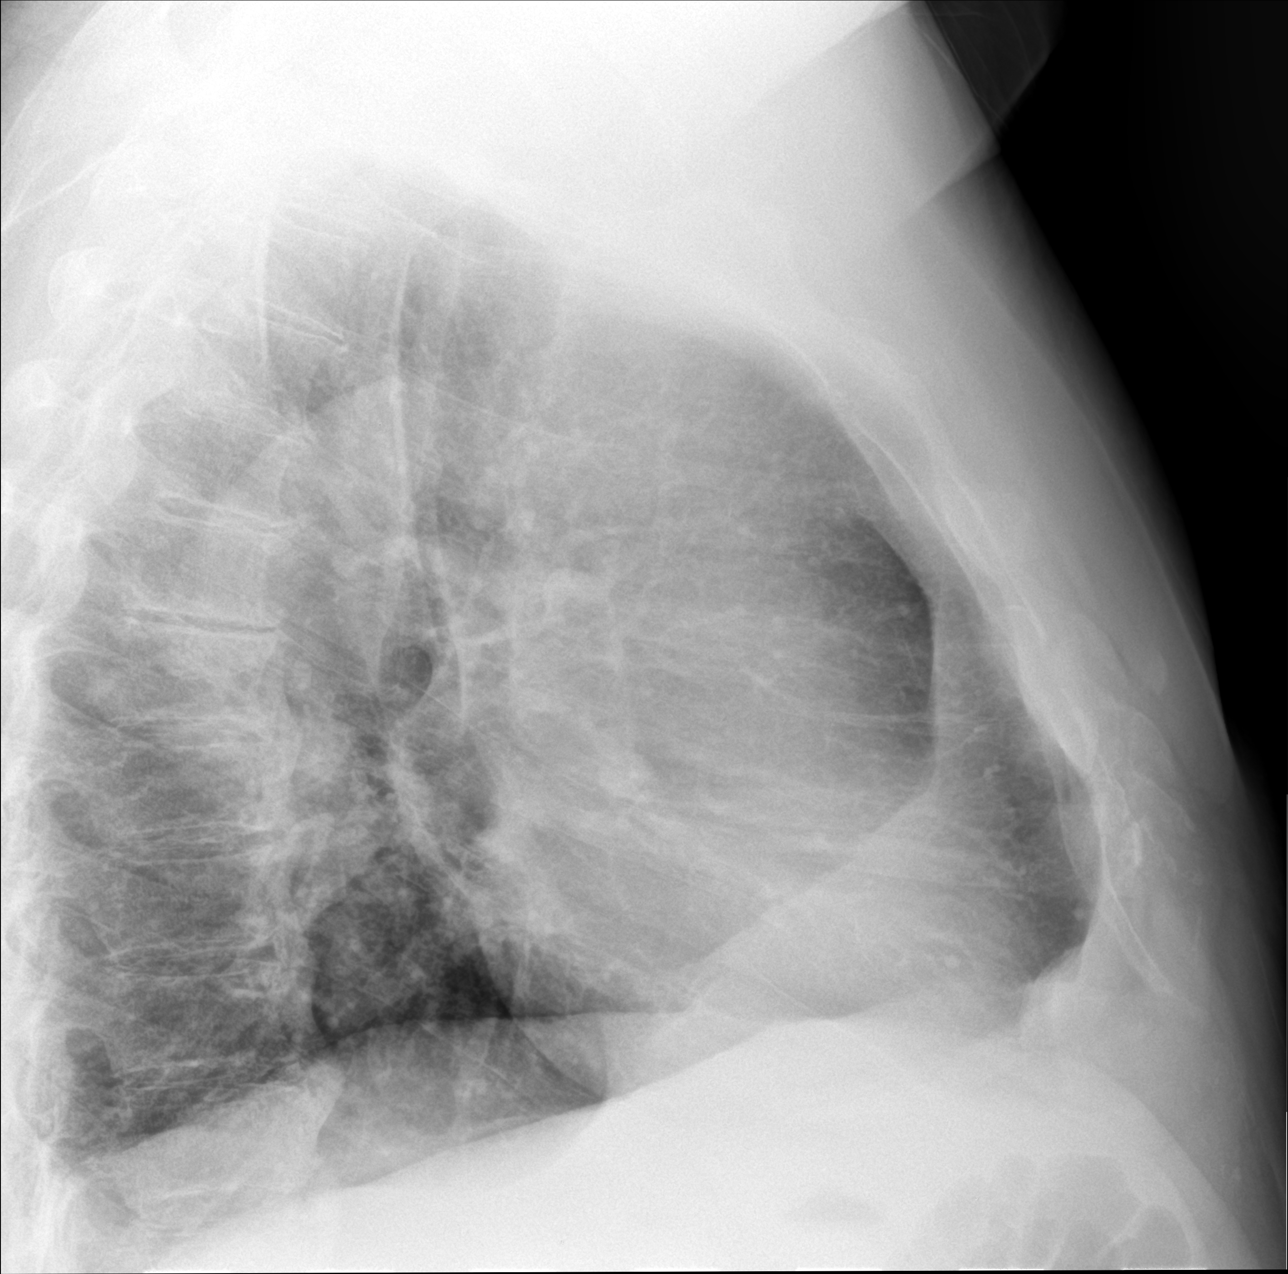

[3 of 3 positions shown; findings below may reference images not displayed]

EXAM

Chest x-ray

INDICATION

pre-op
pre op for surgery, pt denies SOB, cough, chest pain, non smoker

FINDINGS

Two views of the chest were obtained. The prior study was reviewed from 05/13/2021.

The heart size and pulmonary vascular are normal.

There are mild chronic interstitial opacities the parahilar regions which appear unchanged. There
is mild pulmonary hyperinflation.

There is no pulmonary consolidation. There is no pneumothorax.

There is no acute bone lesion.

IMPRESSION

There is no acute appearing radiographic abnormality of the chest. There are mild chronic parahilar
interstitial pulmonary opacities and there is mild pulmonary hyperinflation.

Tech Notes:

pre op for surgery, pt denies SOB, cough, chest pain, non smoker

## 2023-11-21 ENCOUNTER — Encounter: Admit: 2023-11-21 | Discharge: 2023-11-21 | Payer: BC Managed Care – PPO
